# Patient Record
Sex: Female | Born: 1995 | Hispanic: No | Marital: Single | Smoking: Never smoker
Health system: Southern US, Community
[De-identification: ages and names within clinical notes are randomized; demographics above are authoritative.]

---
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
## 2015-01-28 ENCOUNTER — Encounter: Payer: Self-pay | Admitting: Certified Nurse Midwife

## 2015-02-25 ENCOUNTER — Encounter: Payer: Self-pay | Admitting: Obstetrics and Gynecology

## 2015-02-25 ENCOUNTER — Ambulatory Visit (INDEPENDENT_AMBULATORY_CARE_PROVIDER_SITE_OTHER): Payer: 59 | Admitting: Obstetrics and Gynecology

## 2015-02-25 VITALS — BP 100/66 | HR 84 | Resp 16 | Ht 61.5 in | Wt 117.0 lb

## 2015-02-25 DIAGNOSIS — N946 Dysmenorrhea, unspecified: Secondary | ICD-10-CM | POA: Diagnosis not present

## 2015-02-25 MED ORDER — NORETHINDRONE ACET-ETHINYL EST 1-20 MG-MCG PO TABS: 1.0000 | ORAL_TABLET | Freq: Every day | ORAL | Status: AC

## 2015-02-25 NOTE — Progress Notes (Signed)
GYNECOLOGY  VISIT   HPI: 19 y.o.   Single  [place]  female   [id number] with Patient's last menstrual period was 02/15/2015 (approximate).   here for Menstrual problem.     Mother present at the end of the consultation for discussion.   Cramps with menses.  Cramps can even occur one week prior to menses. No diarrhea or nausea.  Taking Advil.  Working out helps.  Flow heavier for first 2 days.   Pad change 3 times a day.   Also wants a check of razor bumps.   Freshman at [employer].   GYNECOLOGIC HISTORY: Patient's last menstrual period was 02/15/2015 (approximate). Contraception: Abstinence.  Not sexually active ever. Menopausal hormone therapy: None Last mammogram: [facility] Last pap smear: Never        OB History    Gravida Para Term Preterm AB TAB SAB Ectopic Multiple Living           There are no active problems to display for this patient.   No past medical history on file.  No past surgical history on file.  Current Outpatient Prescriptions  Medication Sig Dispense Refill  . Multiple Vitamin (MULTIVITAMIN) tablet Take 1 tablet by mouth daily.     No current facility-administered medications for this visit.     ALLERGIES: Review of patient's allergies indicates no known allergies.  Family History  Problem Relation Age of Onset  . Hypertension Mother   . Diabetes Father     Social History   Social History  . Marital Status: Single    Spouse Name: N/A  . Number of Children: N/A  . Years of Education: N/A   Occupational History  . Not on file.   Social History Main Topics  . Smoking status: Never Smoker   . Smokeless tobacco: Never Used  . Alcohol Use: No  . Drug Use: No  . Sexual Activity: No   Other Topics Concern  . Not on file   Social History Narrative    ROS:  Pertinent items are noted in HPI.  PHYSICAL EXAMINATION:    BP 100/66 mmHg  Pulse 84  Resp 16  Ht 5' 1.5" (1.562 m)  Wt 117 lb (53.071 kg)  BMI 21.75  kg/m2  LMP 02/15/2015 (Approximate)    General appearance: alert, cooperative and appears stated age Head: Normocephalic, without obvious abnormality, atraumatic Neck: no adenopathy, supple, symmetrical, trachea midline and thyroid normal to inspection and palpation Lungs: clear to auscultation bilaterally   Heart: regular rate and rhythm Abdomen: soft, non-tender; bowel sounds normal; no masses,  no organomegaly Extremities: extremities normal, atraumatic, no cyanosis or edema Skin: Skin color, texture, turgor normal. No rashes or lesions Lymph nodes: Cervical, supraclavicular, and axillary nodes normal. No abnormal inguinal nodes palpated Neurologic: Grossly normal  Pelvic:  External genitalia with 3 mm hair follicle with eschar noted (shaves).  No erythema.  Chaperone was present for exam.  ASSESSMENT  Dysmenorrhea.   PLAN  Counseled regarding dysmenorrhea.  Discussed tx options of NSAIDs, OCPs, NuvaRing, OrthoEvra, Depo Provera, Nexplanon.  Will take Rx Loestrin 1/20.  See orders.  Discussed contraceptive and noncontraceptive benefits.  Discussed proper use, potential side effects and warning signs of DVT, PE, MI, and stroke. Return in 3 months for a recheck.    An After Visit Summary was printed and given to the patient.  30___ minutes face to face time of which over 50% was spent in counseling.

## 2015-02-25 NOTE — Patient Instructions (Addendum)
Dysmenorrhea Menstrual cramps (dysmenorrhea) are caused by the muscles of the uterus tightening (contracting) during a menstrual period. For some women, this discomfort is merely bothersome. For others, dysmenorrhea can be severe enough to interfere with everyday activities for a few days each month. Primary dysmenorrhea is menstrual cramps that last a couple of days when you start having menstrual periods or soon after. This often begins after a teenager starts having her period. As a woman gets older or has a baby, the cramps will usually lessen or disappear. Secondary dysmenorrhea begins later in life, lasts longer, and the pain may be stronger than primary dysmenorrhea. The pain may start before the period and last a few days after the period.  CAUSES  Dysmenorrhea is usually caused by an underlying problem, such as:  The tissue lining the uterus grows outside of the uterus in other areas of the body (endometriosis).  The endometrial tissue, which normally lines the uterus, is found in or grows into the muscular walls of the uterus (adenomyosis).  The pelvic blood vessels are engorged with blood just before the menstrual period (pelvic congestive syndrome).  Overgrowth of cells (polyps) in the lining of the uterus or cervix.  Falling down of the uterus (prolapse) because of loose or stretched ligaments.  Depression.  Bladder problems, infection, or inflammation.  Problems with the intestine, a tumor, or irritable bowel syndrome.  Cancer of the female organs or bladder.  A severely tipped uterus.  A very tight opening or closed cervix.  Noncancerous tumors of the uterus (fibroids).  Pelvic inflammatory disease (PID).  Pelvic scarring (adhesions) from a previous surgery.  Ovarian cyst.  An intrauterine device (IUD) used for birth control. RISK FACTORS You may be at greater risk of dysmenorrhea if:  You are younger than age 62.  You started puberty early.  You have  irregular or heavy bleeding.  You have never given birth.  You have a family history of this problem.  You are a smoker. SIGNS AND SYMPTOMS   Cramping or throbbing pain in your lower abdomen.  Headaches.  Lower back pain.  Nausea or vomiting.  Diarrhea.  Sweating or dizziness.  Loose stools. DIAGNOSIS  A diagnosis is based on your history, symptoms, physical exam, diagnostic tests, or procedures. Diagnostic tests or procedures may include:  Blood tests.  Ultrasonography.  An examination of the lining of the uterus (dilation and curettage, D&C).  An examination inside your abdomen or pelvis with a scope (laparoscopy).  X-rays.  CT scan.  MRI.  An examination inside the bladder with a scope (cystoscopy).  An examination inside the intestine or stomach with a scope (colonoscopy, gastroscopy). TREATMENT  Treatment depends on the cause of the dysmenorrhea. Treatment may include:  Pain medicine prescribed by your health care provider.  Birth control pills or an IUD with progesterone hormone in it.  Hormone replacement therapy.  Nonsteroidal anti-inflammatory drugs (NSAIDs). These may help stop the production of prostaglandins.  Surgery to remove adhesions, endometriosis, ovarian cyst, or fibroids.  Removal of the uterus (hysterectomy).  Progesterone shots to stop the menstrual period.  Cutting the nerves on the sacrum that go to the female organs (presacral neurectomy).  Electric current to the sacral nerves (sacral nerve stimulation).  Antidepressant medicine.  Psychiatric therapy, counseling, or group therapy.  Exercise and physical therapy.  Meditation and yoga therapy.  Acupuncture. HOME CARE INSTRUCTIONS   Only take over-the-counter or prescription medicines as directed by your health care provider.  Place a heating pad  or hot water bottle on your lower back or abdomen. Do not sleep with the heating pad.  Use aerobic exercises, walking,  swimming, biking, and other exercises to help lessen the cramping.  Massage to the lower back or abdomen may help.  Stop smoking.  Avoid alcohol and caffeine. SEEK MEDICAL CARE IF:   Your pain does not get better with medicine.  You have pain with sexual intercourse.  Your pain increases and is not controlled with medicines.  You have abnormal vaginal bleeding with your period.  You develop nausea or vomiting with your period that is not controlled with medicine. SEEK IMMEDIATE MEDICAL CARE IF:  You pass out.  Document Released: 05/14/2005 Document Revised: 01/14/2013 Document Reviewed: 10/30/2012 [facility] Patient Information 2015 [place], [place]. This information is not intended to replace advice given to you by your health care provider. Make sure you discuss any questions you have with your health care provider.  Ethinyl Estradiol; Norethindrone Acetate tablets (contraception) What is this medicine? ETHINYL ESTRADIOL; NORETHINDRONE ACETATE (ETH in il es tra DYE ole; nor eth IN drone AS e tate) is an oral contraceptive. The products combine two types of female hormones, an estrogen and a progestin. They are used to prevent ovulation and pregnancy. This medicine may be used for other purposes; ask your health care provider or pharmacist if you have questions. COMMON BRAND NAME(S): Rogelia Mire 1.5/30, Junel 1/20, LARIN, Loestrin 1.5/30, Loestrin 1/20, Microgestin 1.5/30, Microgestin 1/20 What should I tell my health care provider before I take this medicine? They need to know if you have or ever had any of these conditions: -abnormal vaginal bleeding -blood vessel disease or blood clots -breast, cervical, endometrial, ovarian, liver, or uterine cancer -diabetes -gallbladder disease -heart disease or recent heart attack -high blood pressure -high cholesterol -kidney disease -liver disease -migraine headaches -stroke -systemic lupus erythematosus (SLE) -tobacco smoker -an  unusual or allergic reaction to estrogens, progestins, other medicines, foods, dyes, or preservatives -pregnant or trying to get pregnant -breast-feeding How should I use this medicine? Take this medicine by mouth. To reduce nausea, this medicine may be taken with food. Follow the directions on the prescription label. Take this medicine at the same time each day and in the order directed on the package. Do not take your medicine more often than directed. Contact your pediatrician regarding the use of this medicine in children. Special care may be needed. This medicine has been used in female children who have started having menstrual periods. A patient package insert for the product will be given with each prescription and refill. Read this sheet carefully each time. The sheet may change frequently. Overdosage: If you think you have taken too much of this medicine contact a poison control center or emergency room at once. NOTE: This medicine is only for you. Do not share this medicine with others. What if I miss a dose? If you miss a dose, refer to the patient information sheet you received with your medicine for direction. If you miss more than one pill, this medicine may not be as effective and you may need to use another form of birth control. What may interact with this medicine? -acetaminophen -antibiotics or medicines for infections, especially rifampin, rifabutin, rifapentine, and griseofulvin, and possibly penicillins or tetracyclines -aprepitant -ascorbic acid (vitamin C) -atorvastatin -barbiturate medicines, such as phenobarbital -bosentan -carbamazepine -caffeine -clofibrate -cyclosporine -dantrolene -doxercalciferol -felbamate -grapefruit juice -hydrocortisone -medicines for anxiety or sleeping problems, such as diazepam or temazepam -medicines for diabetes, including pioglitazone -mineral  oil -modafinil -mycophenolate -nefazodone -oxcarbazepine -phenytoin -prednisolone -ritonavir or other medicines for HIV infection or AIDS -rosuvastatin -selegiline -soy isoflavones supplements -St. John's wort -tamoxifen or raloxifene -theophylline -thyroid hormones -topiramate -warfarin This list may not describe all possible interactions. Give your health care provider a list of all the medicines, herbs, non-prescription drugs, or dietary supplements you use. Also tell them if you smoke, drink alcohol, or use illegal drugs. Some items may interact with your medicine. What should I watch for while using this medicine? Visit your doctor or health care professional for regular checks on your progress. You will need a regular breast and pelvic exam and Pap smear while on this medicine. Use an additional method of contraception during the first cycle that you take these tablets. If you have any reason to think you are pregnant, stop taking this medicine right away and contact your doctor or health care professional. If you are taking this medicine for hormone related problems, it may take several cycles of use to see improvement in your condition. Smoking increases the risk of getting a blood clot or having a stroke while you are taking birth control pills, especially if you are more than 19 years old. You are strongly advised not to smoke. This medicine can make your body retain fluid, making your fingers, hands, or ankles swell. Your blood pressure can go up. Contact your doctor or health care professional if you feel you are retaining fluid. This medicine can make you more sensitive to the sun. Keep out of the sun. If you cannot avoid being in the sun, wear protective clothing and use sunscreen. Do not use sun lamps or tanning beds/booths. If you wear contact lenses and notice visual changes, or if the lenses begin to feel uncomfortable, consult your eye care specialist. In some women,  tenderness, swelling, or minor bleeding of the gums may occur. Notify your dentist if this happens. Brushing and flossing your teeth regularly may help limit this. See your dentist regularly and inform your dentist of the medicines you are taking. If you are going to have elective surgery, you may need to stop taking this medicine before the surgery. Consult your health care professional for advice. This medicine does not protect you against HIV infection (AIDS) or any other sexually transmitted diseases. What side effects may I notice from receiving this medicine? Side effects that you should report to your doctor or health care professional as soon as possible: -breast tissue changes or discharge -changes in vaginal bleeding during your period or between your periods -chest pain -coughing up blood -dizziness or fainting spells -headaches or migraines -leg, arm or groin pain -severe or sudden headaches -stomach pain (severe) -sudden shortness of breath -sudden loss of coordination, especially on one side of the body -speech problems -symptoms of vaginal infection like itching, irritation or unusual discharge -tenderness in the upper abdomen -vomiting -weakness or numbness in the arms or legs, especially on one side of the body -yellowing of the eyes or skin Side effects that usually do not require medical attention (report to your doctor or health care professional if they continue or are bothersome): -breakthrough bleeding and spotting that continues beyond the 3 initial cycles of pills -breast tenderness -mood changes, anxiety, depression, frustration, anger, or emotional outbursts -increased sensitivity to sun or ultraviolet light -nausea -skin rash, acne, or brown spots on the skin -weight gain (slight) This list may not describe all possible side effects. Call your doctor for medical advice about side effects.  You may report side effects to FDA at 1-800-FDA-1088. Where should I  keep my medicine? Keep out of the reach of children. Store at room temperature between 15 and 30 degrees C (59 and 86 degrees F). Throw away any unused medicine after the expiration date. NOTE: This sheet is a summary. It may not cover all possible information. If you have questions about this medicine, talk to your doctor, pharmacist, or health care provider.  2015, [employer]. (2012-09-19 15:35:20)

## 2015-05-31 ENCOUNTER — Telehealth: Payer: Self-pay | Admitting: Obstetrics and Gynecology

## 2015-05-31 NOTE — Telephone Encounter (Signed)
Patient's mom called and canceled her daughter's appointment for 3 month reck. She states she will call back to reschedule at a later time.

## 2015-06-01 NOTE — Telephone Encounter (Signed)
Thank you for the update.  I have closed the encounter.  

## 2015-06-03 ENCOUNTER — Ambulatory Visit: Payer: 59 | Admitting: Obstetrics and Gynecology

## 2017-03-26 DIAGNOSIS — H01002 Unspecified blepharitis right lower eyelid: Secondary | ICD-10-CM | POA: Diagnosis not present

## 2017-03-26 DIAGNOSIS — H01001 Unspecified blepharitis right upper eyelid: Secondary | ICD-10-CM | POA: Diagnosis not present

## 2017-03-29 DIAGNOSIS — H0100A Unspecified blepharitis right eye, upper and lower eyelids: Secondary | ICD-10-CM | POA: Diagnosis not present

## 2017-08-12 DIAGNOSIS — D3122 Benign neoplasm of left retina: Secondary | ICD-10-CM | POA: Diagnosis not present

## 2018-03-03 ENCOUNTER — Other Ambulatory Visit (HOSPITAL_COMMUNITY)
Admission: RE | Admit: 2018-03-03 | Discharge: 2018-03-03 | Disposition: A | Discharge status: Home / Self Care | Payer: BLUE CROSS/BLUE SHIELD | Source: Ambulatory Visit | Attending: Family Medicine | Admitting: Family Medicine

## 2018-03-03 ENCOUNTER — Other Ambulatory Visit: Payer: Self-pay | Admitting: Family Medicine

## 2018-03-03 DIAGNOSIS — Z23 Encounter for immunization: Secondary | ICD-10-CM | POA: Diagnosis not present

## 2018-03-03 DIAGNOSIS — Z01411 Encounter for gynecological examination (general) (routine) with abnormal findings: Secondary | ICD-10-CM | POA: Insufficient documentation

## 2018-03-03 DIAGNOSIS — Z0000 Encounter for general adult medical examination without abnormal findings: Secondary | ICD-10-CM | POA: Diagnosis not present

## 2018-03-04 LAB — CYTOLOGY - PAP: DIAGNOSIS: NEGATIVE

## 2018-07-13 DIAGNOSIS — J029 Acute pharyngitis, unspecified: Secondary | ICD-10-CM | POA: Diagnosis not present

## 2018-08-11 DIAGNOSIS — J069 Acute upper respiratory infection, unspecified: Secondary | ICD-10-CM | POA: Diagnosis not present

## 2018-08-18 DIAGNOSIS — D3122 Benign neoplasm of left retina: Secondary | ICD-10-CM | POA: Diagnosis not present

## 2019-04-08 DIAGNOSIS — Z3009 Encounter for other general counseling and advice on contraception: Secondary | ICD-10-CM | POA: Diagnosis not present

## 2019-04-08 DIAGNOSIS — F419 Anxiety disorder, unspecified: Secondary | ICD-10-CM | POA: Diagnosis not present

## 2019-05-08 DIAGNOSIS — Z0000 Encounter for general adult medical examination without abnormal findings: Secondary | ICD-10-CM | POA: Diagnosis not present

## 2019-10-29 DIAGNOSIS — Z03818 Encounter for observation for suspected exposure to other biological agents ruled out: Secondary | ICD-10-CM | POA: Diagnosis not present

## 2019-10-29 DIAGNOSIS — Z20822 Contact with and (suspected) exposure to covid-19: Secondary | ICD-10-CM | POA: Diagnosis not present

## 2019-12-30 DIAGNOSIS — S46811A Strain of other muscles, fascia and tendons at shoulder and upper arm level, right arm, initial encounter: Secondary | ICD-10-CM | POA: Diagnosis not present

## 2020-02-02 DIAGNOSIS — Z3009 Encounter for other general counseling and advice on contraception: Secondary | ICD-10-CM | POA: Diagnosis not present

## 2020-05-11 DIAGNOSIS — Z0000 Encounter for general adult medical examination without abnormal findings: Secondary | ICD-10-CM | POA: Diagnosis not present
